# Patient Record
Sex: Male | Born: 1960 | Race: White | Hispanic: No | Marital: Single | Smoking: Never smoker
Health system: Southern US, Community
[De-identification: ages and names within clinical notes are randomized; demographics above are authoritative.]

## PROBLEM LIST (undated history)

## (undated) DIAGNOSIS — L570 Actinic keratosis: Secondary | ICD-10-CM

## (undated) DIAGNOSIS — I499 Cardiac arrhythmia, unspecified: Secondary | ICD-10-CM

## (undated) DIAGNOSIS — F439 Reaction to severe stress, unspecified: Secondary | ICD-10-CM

## (undated) DIAGNOSIS — K589 Irritable bowel syndrome without diarrhea: Secondary | ICD-10-CM

## (undated) DIAGNOSIS — K449 Diaphragmatic hernia without obstruction or gangrene: Secondary | ICD-10-CM

## (undated) DIAGNOSIS — F32A Depression, unspecified: Secondary | ICD-10-CM

## (undated) HISTORY — DX: Irritable bowel syndrome, unspecified: K58.9

## (undated) HISTORY — DX: Diaphragmatic hernia without obstruction or gangrene: K44.9

## (undated) HISTORY — DX: Depression, unspecified: F32.A

## (undated) HISTORY — DX: Cardiac arrhythmia, unspecified: I49.9

## (undated) HISTORY — PX: SHOULDER SURGERY: SHX246

## (undated) HISTORY — PX: OTHER SURGICAL HISTORY: SHX169

## (undated) HISTORY — DX: Actinic keratosis: L57.0

## (undated) HISTORY — DX: Reaction to severe stress, unspecified: F43.9

---
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
## 1991-07-04 HISTORY — PX: CARDIAC CATHETERIZATION: SHX172

## 2001-10-21 ENCOUNTER — Emergency Department (HOSPITAL_COMMUNITY): Admission: EM | Admit: 2001-10-21 | Discharge: 2001-10-21 | Payer: Self-pay | Admitting: Emergency Medicine

## 2001-10-21 ENCOUNTER — Encounter: Payer: Self-pay | Admitting: Emergency Medicine

## 2002-04-27 ENCOUNTER — Emergency Department (HOSPITAL_COMMUNITY): Admission: EM | Admit: 2002-04-27 | Discharge: 2002-04-27 | Payer: Self-pay | Admitting: Emergency Medicine

## 2002-05-23 ENCOUNTER — Encounter: Payer: Self-pay | Admitting: Gastroenterology

## 2002-05-23 ENCOUNTER — Encounter: Admission: RE | Admit: 2002-05-23 | Discharge: 2002-05-23 | Payer: Self-pay | Admitting: Gastroenterology

## 2002-06-10 ENCOUNTER — Encounter: Payer: Self-pay | Admitting: Gastroenterology

## 2002-06-10 ENCOUNTER — Ambulatory Visit (HOSPITAL_COMMUNITY): Admission: RE | Admit: 2002-06-10 | Discharge: 2002-06-10 | Payer: Self-pay | Admitting: Gastroenterology

## 2002-07-05 ENCOUNTER — Emergency Department (HOSPITAL_COMMUNITY): Admission: EM | Admit: 2002-07-05 | Discharge: 2002-07-05 | Payer: Self-pay

## 2002-07-08 ENCOUNTER — Ambulatory Visit (HOSPITAL_COMMUNITY): Admission: RE | Admit: 2002-07-08 | Discharge: 2002-07-08 | Payer: Self-pay | Admitting: Gastroenterology

## 2002-08-20 ENCOUNTER — Ambulatory Visit: Admission: RE | Admit: 2002-08-20 | Discharge: 2002-08-20 | Payer: Self-pay | Admitting: Pulmonary Disease

## 2002-09-15 ENCOUNTER — Ambulatory Visit (HOSPITAL_COMMUNITY): Admission: RE | Admit: 2002-09-15 | Discharge: 2002-09-15 | Payer: Self-pay | Admitting: Gastroenterology

## 2004-09-20 ENCOUNTER — Ambulatory Visit: Payer: Self-pay | Admitting: *Deleted

## 2004-10-05 ENCOUNTER — Ambulatory Visit: Payer: Self-pay

## 2006-01-01 ENCOUNTER — Emergency Department (HOSPITAL_COMMUNITY): Admission: EM | Admit: 2006-01-01 | Discharge: 2006-01-01 | Payer: Self-pay | Admitting: Emergency Medicine

## 2006-01-02 ENCOUNTER — Encounter: Admission: RE | Admit: 2006-01-02 | Discharge: 2006-01-02 | Payer: Self-pay | Admitting: Otolaryngology

## 2006-05-12 ENCOUNTER — Emergency Department (HOSPITAL_COMMUNITY): Admission: EM | Admit: 2006-05-12 | Discharge: 2006-05-12 | Payer: Self-pay | Admitting: Emergency Medicine

## 2006-05-21 ENCOUNTER — Encounter (INDEPENDENT_AMBULATORY_CARE_PROVIDER_SITE_OTHER): Payer: Self-pay | Admitting: Specialist

## 2006-05-21 ENCOUNTER — Ambulatory Visit (HOSPITAL_COMMUNITY): Admission: RE | Admit: 2006-05-21 | Discharge: 2006-05-21 | Payer: Self-pay | Admitting: Gastroenterology

## 2006-05-22 ENCOUNTER — Ambulatory Visit (HOSPITAL_COMMUNITY): Admission: RE | Admit: 2006-05-22 | Discharge: 2006-05-22 | Payer: Self-pay | Admitting: Gastroenterology

## 2006-07-19 ENCOUNTER — Ambulatory Visit (HOSPITAL_COMMUNITY): Admission: RE | Admit: 2006-07-19 | Discharge: 2006-07-19 | Payer: Self-pay | Admitting: Gastroenterology

## 2006-07-24 ENCOUNTER — Ambulatory Visit (HOSPITAL_COMMUNITY): Admission: RE | Admit: 2006-07-24 | Discharge: 2006-07-24 | Payer: Self-pay | Admitting: Gastroenterology

## 2007-08-23 ENCOUNTER — Encounter: Admission: RE | Admit: 2007-08-23 | Discharge: 2007-08-23 | Payer: Self-pay | Admitting: Gastroenterology

## 2009-06-19 IMAGING — CT CT PELVIS W/ CM
3 of 6 series · 13 of 46 positions shown, 19 images · IV contrast (READICAT/WATER & [ID] OMNI 300)
Comparison: CT of 07/24/06.

CLINICAL DATA: Followup hepatic cyst.  
ABDOMEN CT WITH CONTRAST:
TECHNIQUE: Multidetector CT imaging of the abdomen was performed following the standard protocol during bolus administration of intravenous contrast.
Contrast:   cc Omnipaque 300
TECHNIQUE: Multidetector CT imaging of the pelvis was performed following the standard protocol during bolus administration of intravenous contrast.

[Series 3: routine abdomen · axial · 0.70mm/px · z∈[-359,-64]mm · 9 of 75 slices shown, 15 images]
[im 8/75  soft-tissue]
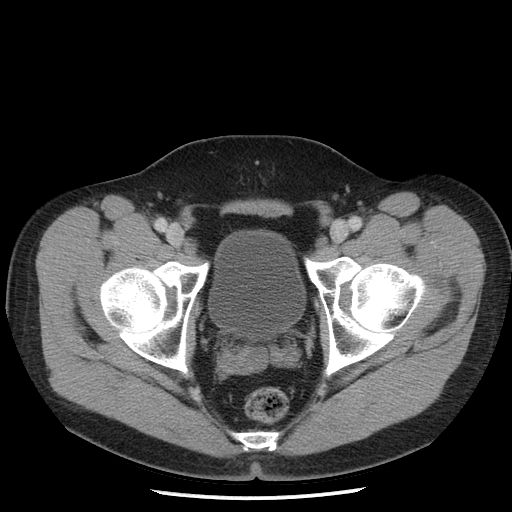
[im 8/75  bone]
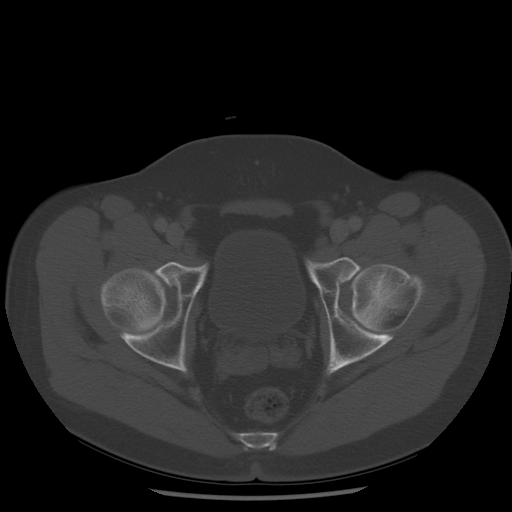
[im 15/75  soft-tissue]
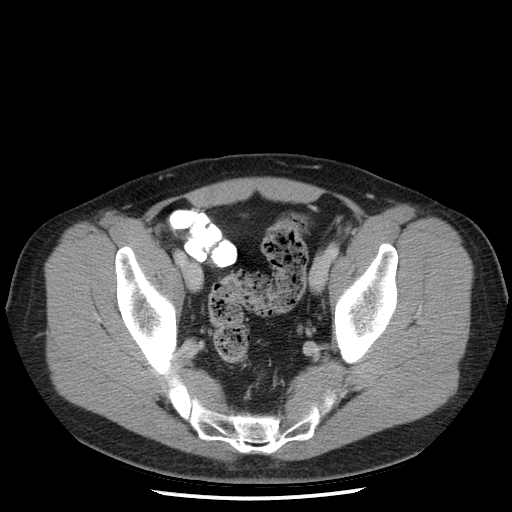
[im 23/75  soft-tissue]
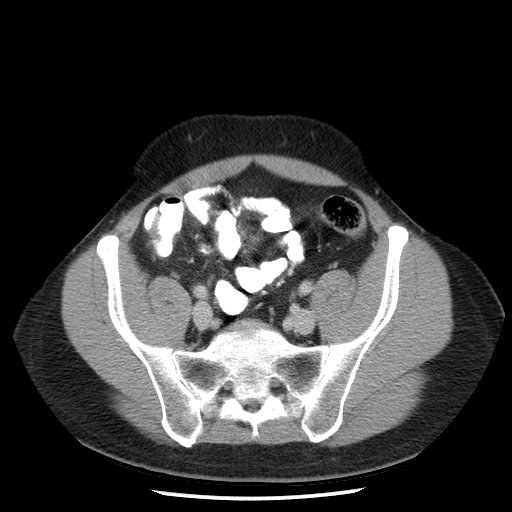
[im 30/75  soft-tissue]
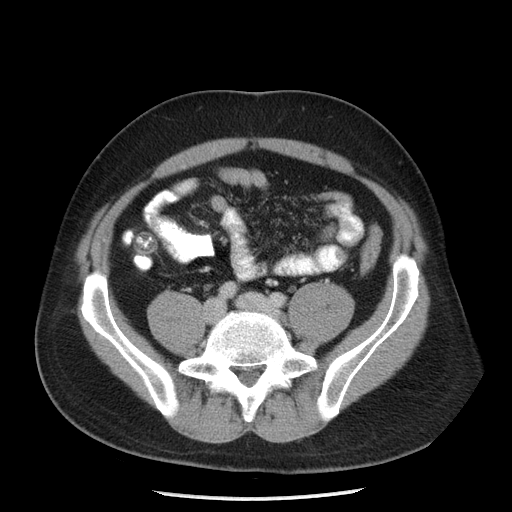
[im 38/75  soft-tissue]
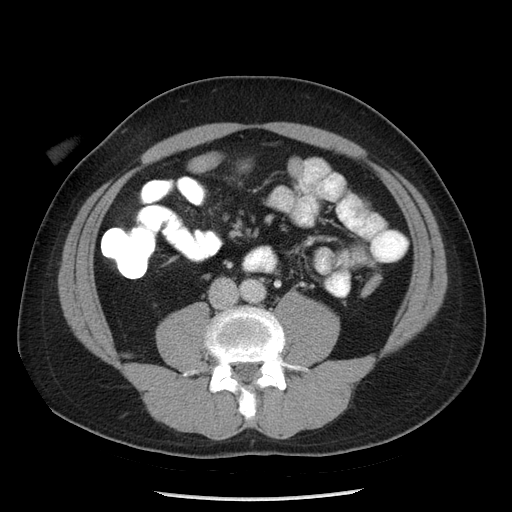
[im 45/75  soft-tissue]
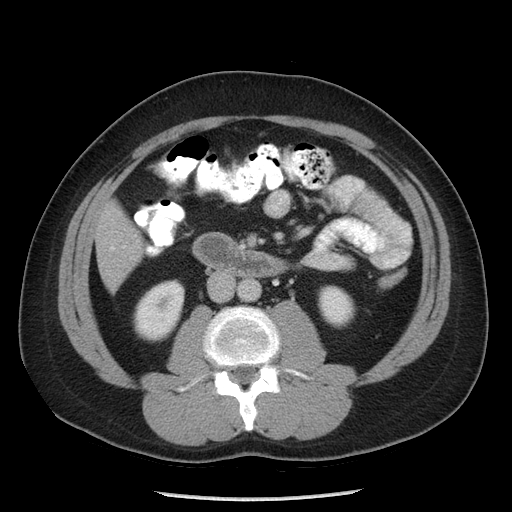
[im 45/75  lung]
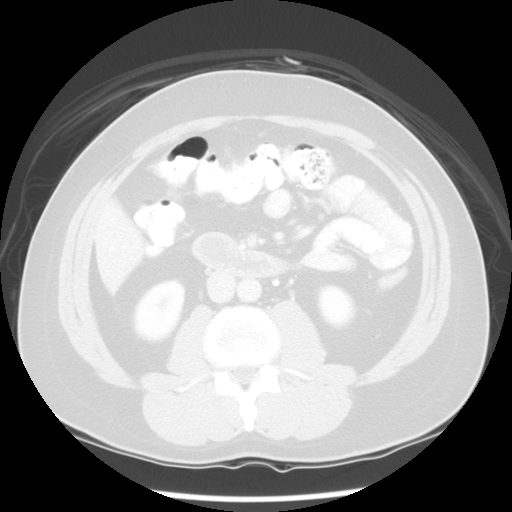
[im 52/75  soft-tissue]
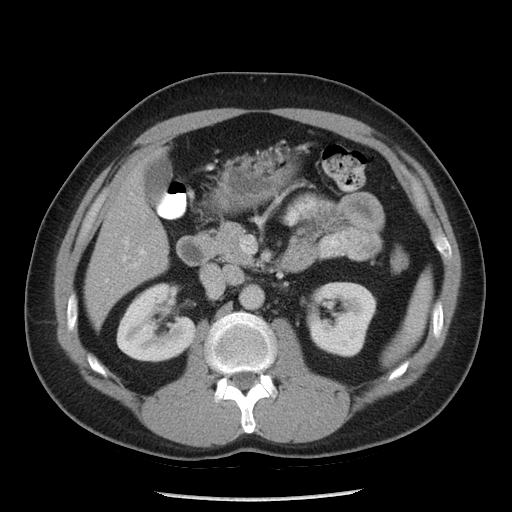
[im 52/75  lung]
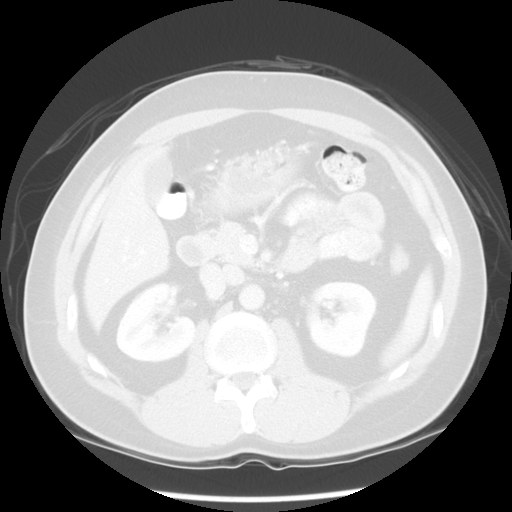
[im 60/75  soft-tissue]
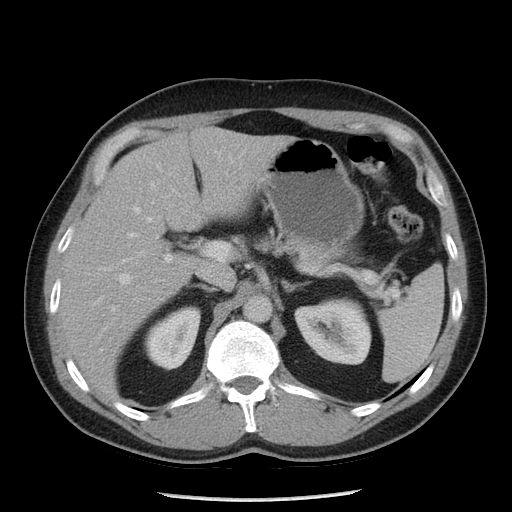
[im 60/75  lung]
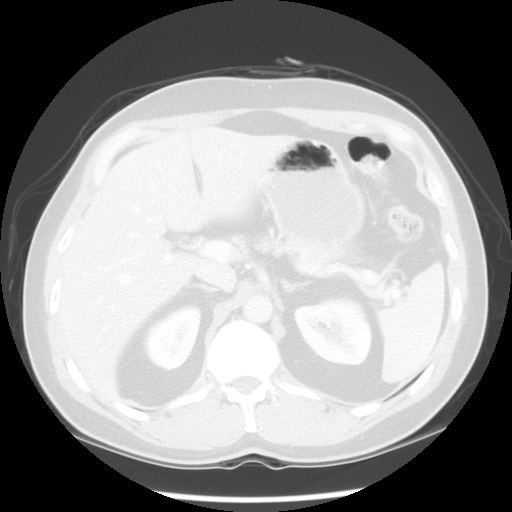
[im 67/75  soft-tissue]
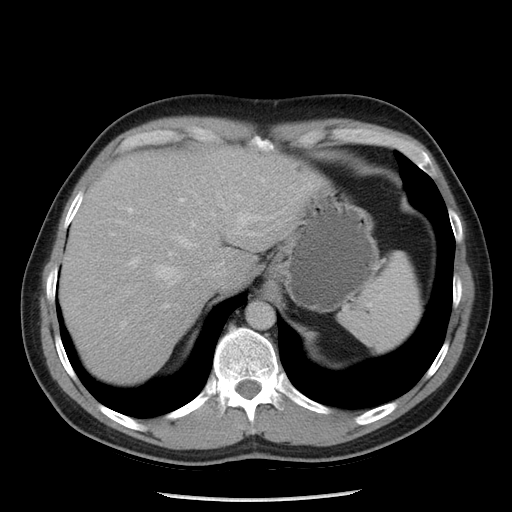
[im 67/75  lung]
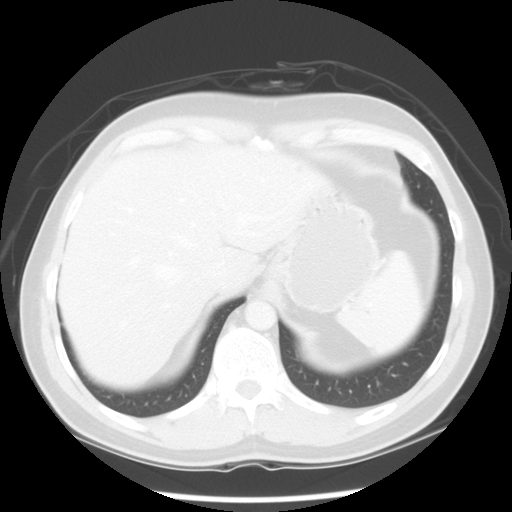
[im 67/75  bone]
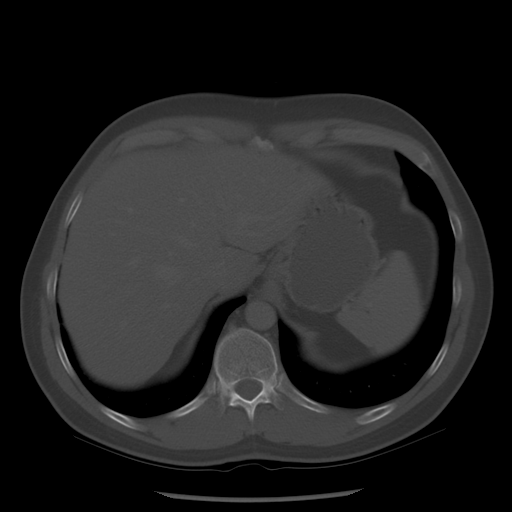

[Series 601: coronal body · coronal · 0.80mm/px · 1 of 119 slices shown]
[im 40/119  soft-tissue]
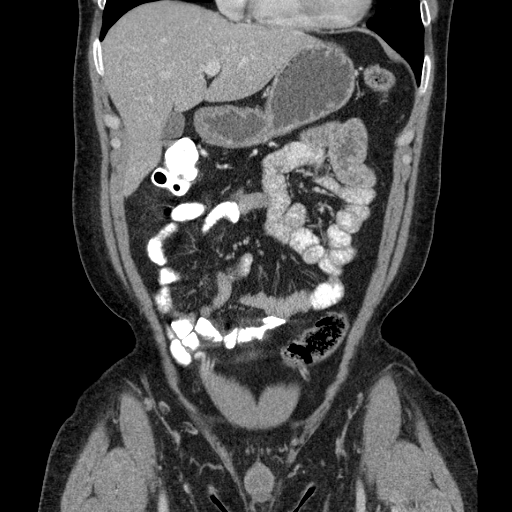

[Series 602: sagittal body · sagittal · 0.80mm/px · 3 of 145 slices shown]
[im 49/145  soft-tissue]
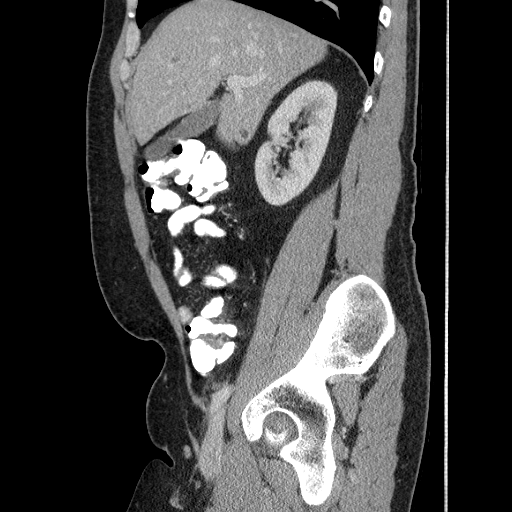
[im 65/145  soft-tissue]
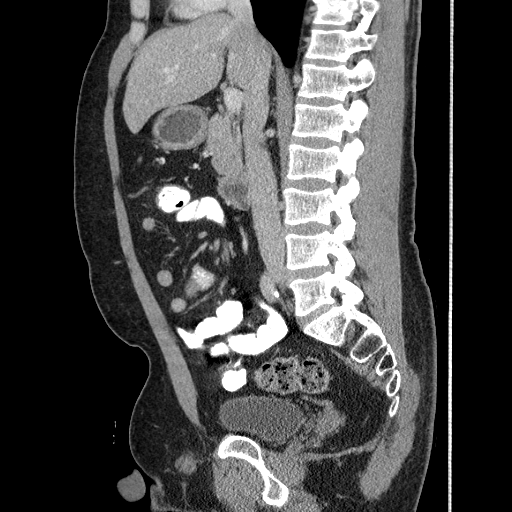
[im 81/145  soft-tissue]
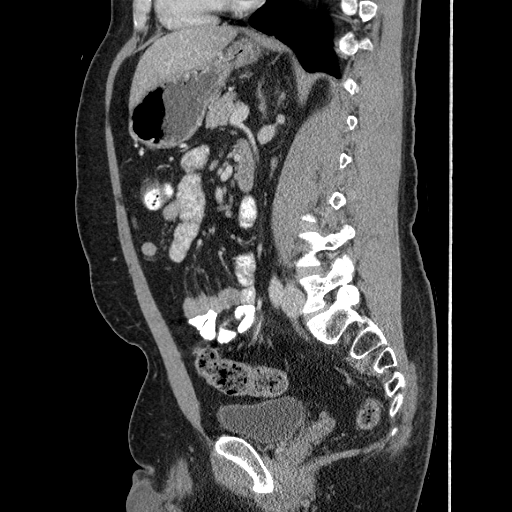

[13 of 46 positions shown; findings below may reference images not displayed]

FINDINGS: The lung bases are clear.  The small low attenuation structures primarily in the right lobe of liver appear completely stable.  On delayed images the majority of these small low attenuation structures persist and therefore these are most consistent with small hepatic cysts.  No new hepatic lesion is seen and no ducal dilatation is noted.  No calcified gallstones are noted.  The pancreas is normal in size and the pancreatic duct is not dilated.  The adrenal glands and spleen appear normal.  The kidneys enhance and on delayed images the pelvocaliceal systems appear normal.  The abdominal aorta is normal in caliber.
IMPRESSION: The previously noted small low attenuation hepatic lesions are stable in size and number and are most consistent with small cysts.  No new abnormality is seen. 
PELVIS CT WITH CONTRAST:
FINDINGS: The urinary bladder is unremarkable.   Terminal ileum appears normal.  No pelvic mass or fluid is seen.  The prostate is normal in size.  No bony abnormality is noted.
IMPRESSION: Negative CT of the pelvis.

## 2010-07-24 ENCOUNTER — Encounter: Payer: Self-pay | Admitting: Gastroenterology

## 2010-11-09 ENCOUNTER — Emergency Department (HOSPITAL_COMMUNITY)
Admission: EM | Admit: 2010-11-09 | Discharge: 2010-11-09 | Disposition: A | Discharge status: Home / Self Care | Payer: BC Managed Care – PPO | Attending: Emergency Medicine | Admitting: Emergency Medicine

## 2010-11-09 DIAGNOSIS — R350 Frequency of micturition: Secondary | ICD-10-CM | POA: Insufficient documentation

## 2010-11-09 DIAGNOSIS — R5383 Other fatigue: Secondary | ICD-10-CM | POA: Insufficient documentation

## 2010-11-09 DIAGNOSIS — R5381 Other malaise: Secondary | ICD-10-CM | POA: Insufficient documentation

## 2010-11-09 DIAGNOSIS — M549 Dorsalgia, unspecified: Secondary | ICD-10-CM | POA: Insufficient documentation

## 2010-11-09 DIAGNOSIS — G4700 Insomnia, unspecified: Secondary | ICD-10-CM | POA: Insufficient documentation

## 2010-11-09 LAB — DIFFERENTIAL
Eosinophils Absolute: 0.2 10*3/uL (ref 0.0–0.7)
Lymphs Abs: 2.4 10*3/uL (ref 0.7–4.0)
Monocytes Absolute: 0.9 10*3/uL (ref 0.1–1.0)

## 2010-11-09 LAB — URINALYSIS, ROUTINE W REFLEX MICROSCOPIC
Hgb urine dipstick: NEGATIVE
Ketones, ur: NEGATIVE mg/dL
Protein, ur: NEGATIVE mg/dL
Specific Gravity, Urine: 1.012 (ref 1.005–1.030)
Urobilinogen, UA: 0.2 mg/dL (ref 0.0–1.0)

## 2010-11-09 LAB — CBC
Platelets: 264 10*3/uL (ref 150–400)
RBC: 4.36 MIL/uL (ref 4.22–5.81)
RDW: 12.6 % (ref 11.5–15.5)

## 2010-11-09 LAB — BASIC METABOLIC PANEL
CO2: 27 mEq/L (ref 19–32)
Calcium: 9.3 mg/dL (ref 8.4–10.5)
Creatinine, Ser: 0.82 mg/dL (ref 0.4–1.5)
GFR calc Af Amer: >60 mL/min (ref >60)
GFR calc non Af Amer: >60 mL/min (ref >60)
Glucose, Bld: 99 mg/dL (ref 70–99)
Potassium: 4 mEq/L (ref 3.5–5.1)
Sodium: 136 mEq/L (ref 135–145)

## 2010-11-09 LAB — HIV ANTIBODY (ROUTINE TESTING W REFLEX): HIV: NONREACTIVE

## 2010-11-18 NOTE — Op Note (Signed)
   NAME:  Juan Garner, Juan Garner                         ACCOUNT NO.:  [id number]   MEDICAL RECORD NO.:  [id number]                   PATIENT TYPE:  AMB   LOCATION:  ENDO                                 FACILITY:  [facility]   PHYSICIAN:  Anselmo Rod, M.D.               DATE OF BIRTH:  Dec 06, 1960   DATE OF PROCEDURE:  07/08/2002  DATE OF DISCHARGE:                                 OPERATIVE REPORT   PROCEDURE:  Esophagogastroduodenoscopy endoscopy.   ENDOSCOPIST:  Anselmo Rod, M.D.   INSTRUMENT USED:  Olympus video panendoscope.   INDICATIONS FOR PROCEDURE:  The patient is a 50 year old white male with a  history of epigastric burning, retrosternal pain in spite of double dose  proton pump inhibitors used and H2 blockers at bedtime. Rule out ulcer  disease.   <PREPROCEDURE PREPARATION/>  Informed consent was procured from the patient. The patient was fasted for 8  hours prior to  the procedure.   <PREPROCEDURE PHYSICAL/>  The patient had stable vital signs. Neck was supple. Chest was clear to  auscultation, S1, S2. Abdomen soft with normal bowel sounds.   DESCRIPTION OF PROCEDURE:  The patient was placed in the left lateral  decubitus position and sedated with 30 mg of Demerol and 4 mg of Versed  intravenously. Once the patient was  adequately sedated and maintained on  low flow oxygen and continuous cardiac monitoring, the Olympus  video  panendoscope was advanced through the mouthpiece over the tongue into the  esophagus under direct visualization.   The entire esophagus appeared normal with no evidence of ring, stricture,  masses, esophagitis or Barrett's mucosa.  The scope was then advanced into  the stomach.  The entire gastric mucosa appears healthy. Retroflexed view  revealed no abnormalities. There is mild duodenitis in the duodenal bulb.  The small bowel ileocecal valve appeared normal up to 60 cm.   IMPRESSION:  Normal EGD except for very mild duodenitis.   RECOMMENDATIONS:  1. Continue proton pump inhibitors.  2.     Follow anti reflux measures.  3. Avoid nonsteroidals, including aspirin.  4. A 24-hour pH if the symptoms persist.                                               Anselmo Rod, M.D.    JNM/MEDQ  D:  07/09/2002  T:  07/09/2002  Job:  [id number]   cc:   Lenard Lance  [address] [id number]  [place]  [place] [address]  Fax: [phone]

## 2010-11-18 NOTE — Op Note (Signed)
NAMEROMAIN, ERION NO.:  [id number]   MEDICAL RECORD NO.:  [id number]          PATIENT TYPE:  OUT   LOCATION:  XRAY                         FACILITY:  [facility]   PHYSICIAN:  Anselmo Rod, M.D.  DATE OF BIRTH:  [birth year]   DATE OF PROCEDURE:  05/21/2006  DATE OF DISCHARGE:                                 OPERATIVE REPORT   PROCEDURE PERFORMED:  Esophagogastroduodenoscopy with esophageal and gastric  biopsies.   ENDOSCOPIST:  Anselmo Rod, M.D.   INSTRUMENT USED:  Olympus video panendoscope.   INDICATIONS FOR PROCEDURE:  50 year old white male with a history of  epigastric pain, chest pain, and reflux undergoing EGD to rule out peptic  ulcer disease, esophagitis, gastritis, etc.   PREPROCEDURE PREPARATION:  Informed consent was procured from the patient.  The patient was fasted for 8 hours prior to procedure.  The risks and  benefits of the procedure, including bleeding, perforation, etc., were  discussed with the patient in great detail.   PREPROCEDURE PHYSICAL:  VITAL SIGNS:  Stable.  NECK:  Supple.  CHEST:  Clear to auscultation.  S1 and S2 regular.  ABDOMEN:  Soft with normal bowel sounds.   DESCRIPTION OF PROCEDURE:  The patient was placed in the left lateral  decubitus position and sedated with 100 mcg of fentanyl and 10 mg of Versed  given intravenously in slow incremental doses.  Once the patient was  adequately sedated and maintained on low-flow oxygen and continuous cardiac  monitoring, the Olympus video panendoscope was advanced through the  mouthpiece, over the tongue, and into the esophagus under direct vision.  A  small patch of what appeared to be Barrett's-like mucosa was biopsied above  the Z-line.  The rest of the esophagus appeared normal.  There was no  evidence of stricture or esophagitis or masses.  There was diffuse gastritis  noted on advancing the scope into the stomach.  Biopsies were done to rule  out the presence of H  pylori by pathology.  The proximal small bowel  appeared normal.  There was no obstruction.  Retroflexion in the high cardia  revealed no abnormalities.  The patient tolerated the procedure well without  immediate complications.   IMPRESSION:  1. Question Barrett's mucosa.  Small patch biopsied above the Z-line.  2. Diffuse gastritis, biopsies done for Helicobacter pylori.  3. Normal proximal small bowel.  4. No ulcers, erosions, masses, or polyps identified.   RECOMMENDATIONS:  1. Await pathology results.  2. Avoid all nonsteroidals for now.  3. Continue Prevacid as before along with Zantac at bedtime.  4. Proceed with an abdominal ultrasound and HIDA scan in the morning as      planned.  5. Outpatient followup within the next 2 weeks for further      recommendations.      Anselmo Rod, M.D.  Electronically Signed     JNM/MEDQ  D:  05/22/2006  T:  05/22/2006  Job:  [id number]

## 2021-03-02 ENCOUNTER — Encounter: Payer: Self-pay | Admitting: *Deleted

## 2021-03-03 ENCOUNTER — Other Ambulatory Visit: Payer: Self-pay

## 2021-03-03 ENCOUNTER — Encounter: Payer: Self-pay | Admitting: Neurology

## 2021-03-03 ENCOUNTER — Ambulatory Visit: Payer: BC Managed Care – PPO | Admitting: Neurology

## 2021-03-03 VITALS — BP 113/67 | HR 87 | Ht 69.5 in | Wt 190.0 lb

## 2021-03-03 DIAGNOSIS — Z820 Family history of epilepsy and other diseases of the nervous system: Secondary | ICD-10-CM

## 2021-03-03 DIAGNOSIS — G478 Other sleep disorders: Secondary | ICD-10-CM

## 2021-03-03 DIAGNOSIS — G4719 Other hypersomnia: Secondary | ICD-10-CM | POA: Diagnosis not present

## 2021-03-03 DIAGNOSIS — E663 Overweight: Secondary | ICD-10-CM

## 2021-03-03 DIAGNOSIS — R002 Palpitations: Secondary | ICD-10-CM

## 2021-03-03 DIAGNOSIS — G479 Sleep disorder, unspecified: Secondary | ICD-10-CM

## 2021-03-03 DIAGNOSIS — R0683 Snoring: Secondary | ICD-10-CM | POA: Diagnosis not present

## 2021-03-03 NOTE — Progress Notes (Signed)
Subjective:    Patient ID: Juan Garner is a 60 y.o. male.  HPI   Huston Foley, MD, PhD [facility] [address] [address] [place], [place] [address]  Dear Dr. Leonor Liv,   I saw your patient, Juan Garner, upon your kind request in my sleep clinic today for initial consultation of his sleep disorder, in particular, nonrestorative sleep and sleep disruption.  The patient is unaccompanied today.  As you know, [name] is a 60 year old right-handed gentleman with an underlying medical history of irritable bowel syndrome, Hiatal hernia, depression, and overweight state, who reports a longstanding history of difficulty maintaining sleep.  He has snored off and on, currently lives alone.  He is not sure if he has breathing pauses while asleep, has not woken up with a sense of gasping for air but has had nighttime palpitations before.  He has tried melatonin at night which helped him sleep just a little bit longer at night.  He tries to keep a schedule.  Bedtime is generally around 10 PM and rise time around 6 AM.  He does not have a TV in his bedroom and does not have any pets in the house, no children.  He drinks caffeine in the form of green tea, generally 1 serving per day.  He is a non-smoker and does not drink alcohol.  He helps take care of his parents, they live close by, both are in their 51s, dad is on hemodialysis. I reviewed your office note from 12/20/2020.  His Epworth sleepiness score is 2 out of 24, fatigue severity score is 21 out of 63.  He has recently been started on imipramine.  He was on it in the past.  He was started on 20 mg at bedtime and is now taking 30 mg at bedtime.  He does recall dreaming but very briefly and does not recall his dreams well.  He denies any telltale symptoms of restless leg syndrome.  He does not have any night to night nocturia or recurrent morning headaches.  His father has sleep apnea but is not currently using a CPAP  machine.  Patient had a tonsillectomy at age 88.  His Past Medical History Is Significant For: Past Medical History:  Diagnosis Date   Arrhythmia    Depression    Hiatal hernia    Irritable bowel    Keratosis    Stress    His Past Surgical History Is Significant For:   His Family History Is Significant For: Family History  Problem Relation Age of Onset   Actinic keratosis Mother    Hypertension Father    Diabetes Father    Hearing loss Father    Depression Father    Vision loss Father     His Social History Is Significant For: Social History   Socioeconomic History   Marital status: Single    Spouse name: Not on file   Number of children: Not on file   Years of education: Not on file   Highest education level: Not on file  Occupational History   Not on file  Tobacco Use   Smoking status: Never   Smokeless tobacco: Never  Vaping Use   Vaping Use: Never used  Substance and Sexual Activity   Alcohol use: Not on file    Comment: never   Drug use: Never   Sexual activity: Not on file  Other Topics Concern   Not on file  Social History Narrative  PHD literature, 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  Caffeine green tea, dk chocolate.    Social Determinants of Health   Financial Resource Strain: Not on file  Food Insecurity: Not on file  Transportation Needs: Not on file  Physical Activity: Not on file  Stress: Not on file  Social Connections: Not on file    His Allergies Are:  No Known Allergies:   His Current Medications Are:  Outpatient Encounter Medications as of 03/03/2021  Medication Sig   Biotin w/ Vitamins C & E (HAIR/SKIN/NAILS PO) Take by mouth daily. For men.   imipramine (TOFRANIL) 10 MG tablet Take 30 mg by mouth at bedtime.   lansoprazole (PREVACID) 15 MG capsule Take by mouth daily as needed.   Multiple Vitamin (MULTIVITAMIN) capsule Take 1 capsule by mouth daily.   VITAMIN D, CHOLECALCIFEROL, PO Take by mouth daily. 1000units   [DISCONTINUED] b  complex vitamins capsule Take by mouth.   No facility-administered encounter medications on file as of 03/03/2021.  :   Review of Systems:  Out of a complete 14 point review of systems, all are reviewed and negative with the exception of these symptoms as listed below:   Review of Systems  Neurological:        Sleep disorder/disruptive sleep. ESS: 2, FSS 21. Always feeling tired, never refreshed.    Objective:  Neurological Exam  Physical Exam Physical Examination:   Vitals:   03/03/21 1502  BP: 113/67  Pulse: 87    General Examination: The patient is a very pleasant 60 y.o. male in no acute distress. He appears well-developed and well-nourished and well groomed.   HEENT: Normocephalic, atraumatic, pupils are equal, round and reactive to light, extraocular tracking is good without limitation to gaze excursion or nystagmus noted. Hearing is grossly intact. Face is symmetric with normal facial animation. Speech is clear with no dysarthria noted. There is no hypophonia. There is no lip, neck/head, jaw or voice tremor. Neck is supple with full range of passive and active motion. There are no carotid bruits on auscultation. Oropharynx exam reveals: mild mouth dryness, adequate dental hygiene and mild airway crowding, due to redundant soft palate, small airway entry, wider uvula.  Tonsils absent, Mallampati class II.  Neck circumference of 17 and three-quarter inches.  Tongue protrudes centrally and palate elevates symmetrically.  Chest: Clear to auscultation without wheezing, rhonchi or crackles noted.  Heart: S1+S2+0, regular and normal without murmurs, rubs or gallops noted.   Abdomen: Soft, non-tender and non-distended with normal bowel sounds appreciated on auscultation.  Extremities: There is no pitting edema in the distal lower extremities bilaterally.   Skin: Warm and dry without trophic changes noted.   Musculoskeletal: exam reveals no obvious joint deformities, tenderness or  joint swelling or erythema.   Neurologically:  Mental status: The patient is awake, alert and oriented in all 4 spheres. His immediate and remote memory, attention, language skills and fund of knowledge are appropriate. There is no evidence of aphasia, agnosia, apraxia or anomia. Speech is clear with normal prosody and enunciation. Thought process is linear. Mood is normal and affect is normal.  Cranial nerves II - XII are as described above under HEENT exam.  Motor exam: Normal bulk, strength and tone is noted. There is no tremor, Romberg is negative. Fine motor skills and coordination: grossly intact.  Cerebellar testing: No dysmetria or intention tremor. There is no truncal or gait ataxia.  Sensory exam: intact to light touch in the upper and lower extremities.  Gait, station and balance: He stands easily. No veering to one side is noted. No leaning to one side is noted. Posture is age-appropriate and stance is narrow based. Gait shows normal stride length and normal pace. No problems turning are noted. Tandem walk is unremarkable.                Assessment and Plan:     In summary, LOVELACE CERVENY is a very pleasant 60 y.o.-year old male with an underlying medical history of irritable bowel syndrome, Hiatal hernia, depression, and overweight state, whose history and physical exam are concerning for obstructive sleep apnea (OSA). I had a long chat with the patient about my findings and the diagnosis of OSA, its prognosis and treatment options. We talked about medical treatments, surgical interventions and non-pharmacological approaches. I explained in particular the risks and ramifications of untreated moderate to severe OSA, especially with respect to developing cardiovascular disease down the Road, including congestive heart failure, difficult to treat hypertension, cardiac arrhythmias, or stroke. Even type 2 diabetes has, in part, been linked to untreated OSA. Symptoms of untreated OSA include  daytime sleepiness, memory problems, mood irritability and mood disorder such as depression and anxiety, lack of energy, as well as recurrent headaches, especially morning headaches. We talked about trying to maintain a healthy lifestyle in general, as well as the importance of weight control. We also talked about the importance of good sleep hygiene. I recommended the following at this time: sleep study.  I explained the difference between a laboratory attended sleep study versus home sleep test.  I outlined possible treatment options for sleep apnea.  We talked about the use of CPAP therapy as well.  We will pick up our discussion after testing and plan to follow-up accordingly.  We will keep him posted as to his test results by phone or via [employer]. I answered all his questions today and he was in agreement. Thank you very much for allowing me to participate in the care of this nice patient. If I can be of any further assistance to you please do not hesitate to call me at [phone].  Sincerely,   Huston Foley, MD, PhD

## 2021-03-03 NOTE — Patient Instructions (Signed)

## 2021-04-21 ENCOUNTER — Other Ambulatory Visit: Payer: Self-pay

## 2021-04-21 ENCOUNTER — Ambulatory Visit (INDEPENDENT_AMBULATORY_CARE_PROVIDER_SITE_OTHER): Payer: BC Managed Care – PPO | Admitting: Neurology

## 2021-04-21 DIAGNOSIS — Z820 Family history of epilepsy and other diseases of the nervous system: Secondary | ICD-10-CM

## 2021-04-21 DIAGNOSIS — E663 Overweight: Secondary | ICD-10-CM

## 2021-04-21 DIAGNOSIS — R0683 Snoring: Secondary | ICD-10-CM | POA: Diagnosis not present

## 2021-04-21 DIAGNOSIS — G4719 Other hypersomnia: Secondary | ICD-10-CM

## 2021-04-21 DIAGNOSIS — G478 Other sleep disorders: Secondary | ICD-10-CM

## 2021-04-21 DIAGNOSIS — R002 Palpitations: Secondary | ICD-10-CM

## 2021-04-21 DIAGNOSIS — G479 Sleep disorder, unspecified: Secondary | ICD-10-CM

## 2021-04-21 DIAGNOSIS — G4720 Circadian rhythm sleep disorder, unspecified type: Secondary | ICD-10-CM

## 2021-04-29 NOTE — Procedures (Signed)
PATIENT'S NAME:  Juan Garner, Juan Garner DOB:      Aug 24, 1960      MR#:    [id number]     DATE OF RECORDING: 04/21/2021 REFERRING M.D.:  Juleen China, MD Study Performed:   Baseline Polysomnogram HISTORY: 60 year old man with a history of irritable bowel syndrome, hiatal hernia, depression, and overweight state, who reports a longstanding history of difficulty maintaining sleep, some snoring. The patient endorsed the Epworth Sleepiness Scale at 2 points. The patient's weight 190 pounds with a height of 70 (inches), resulting in a BMI of 27.5 kg/m2. The patient's neck circumference measured 17.8 inches.  CURRENT MEDICATIONS: Biotin, Tofranil, Prevacid, Multivitamins, Vitamin D   PROCEDURE:  This is a multichannel digital polysomnogram utilizing the Somnostar 11.2 system.  Electrodes and sensors were applied and monitored per AASM Specifications.   EEG, EOG, Chin and Limb EMG, were sampled at 200 Hz.  ECG, Snore and Nasal Pressure, Thermal Airflow, Respiratory Effort, CPAP Flow and Pressure, Oximetry was sampled at 50 Hz. Digital video and audio were recorded.      BASELINE STUDY  Lights Out was at 22:05 and Lights On at 05:03.  Total recording time (TRT) was 418.5 minutes, with a total sleep time (TST) of 330.5 minutes. The patient's sleep latency was 66 minutes, which is delayed. REM latency was 75.5 minutes, which is normal. The sleep efficiency was 79. %.     SLEEP ARCHITECTURE: WASO (Wake after sleep onset) was 27 minutes with mild sleep fragmentation noted.  There were 20 minutes in Stage N1, 212 minutes Stage N2, 5 minutes Stage N3 and 93.5 minutes in Stage REM.  The percentage of Stage N1 was 6.1%, Stage N2 was 64.1%, which in increased, Stage N3 was 1.5% and Stage R (REM sleep) was 28.3%, which is increased. The arousals were noted as: 30 were spontaneous, 0 were associated with PLMs, 1 were associated with respiratory events.  RESPIRATORY ANALYSIS:  There were a total of 5 respiratory events:  0  obstructive apneas, 0 central apneas and 3 mixed apneas with a total of 3 apneas and an apnea index (AI) of .5 /hour. There were 2 hypopneas with a hypopnea index of .4 /hour. The patient also had 0 respiratory event related arousals (RERAs).      The total APNEA/HYPOPNEA INDEX (AHI) was .9/hour and the total RESPIRATORY DISTURBANCE INDEX was  .9 /hour.  5 events occurred in REM sleep and 0 events in NREM. The REM AHI was  3.2 /hour, versus a non-REM AHI of 0. The patient spent 36 minutes of total sleep time in the supine position and 295 minutes in non-supine.. The supine AHI was 3.4 versus a non-supine AHI of 0.6.  OXYGEN SATURATION & C02:  The Wake baseline 02 saturation was 93%, with the lowest being 90%. Time spent below 89% saturation equaled 0 minutes.  PERIODIC LIMB MOVEMENTS: The patient had a total of 0 Periodic Limb Movements.  The Periodic Limb Movement (PLM) index was 0 and the PLM Arousal index was 0/hour. Audio and video analysis did not show any abnormal or unusual movements, behaviors, phonations or vocalizations. The patient took 1 bathroom break. Mild snoring was noted. The EKG was in keeping with normal sinus rhythm (NSR).  Post-study, the patient indicated that sleep was the same as usual.   IMPRESSION:  Primary Snoring Dysfunctions associated with sleep stages or arousal from sleep  RECOMMENDATIONS:  This study does not demonstrate any significant obstructive or central sleep disordered breathing with the exception of  snoring. This study does not support an intrinsic sleep disorder as a cause of the patient's symptoms. Other causes, including circadian rhythm disturbances, an underlying mood disorder, medication effect and/or an underlying medical problem cannot be ruled out. This study shows sleep fragmentation and abnormal sleep stage percentages; these are nonspecific findings and per se do not signify an intrinsic sleep disorder or a cause for the patient's sleep-related  symptoms. Causes include (but are not limited to) the first night effect of the sleep study, circadian rhythm disturbances, medication effect or an underlying mood disorder or medical problem.  The patient should be cautioned not to drive, work at heights, or operate dangerous or heavy equipment when tired or sleepy. Review and reiteration of good sleep hygiene measures should be pursued with any patient. The patient will be advised to follow up with the referring provider, who will be notified of the test results.  I certify that I have reviewed the entire raw data recording prior to the issuance of this report in accordance with the Standards of Accreditation of the American Academy of Sleep Medicine (AASM)   Huston Foley, MD, PhD Diplomat, American Board of Neurology and Sleep Medicine (Neurology and Sleep Medicine)

## 2021-05-02 ENCOUNTER — Encounter: Payer: Self-pay | Admitting: Neurology

## 2021-05-02 ENCOUNTER — Telehealth: Payer: Self-pay | Admitting: Neurology

## 2021-05-02 NOTE — Telephone Encounter (Signed)
Called pt back. He read the results on mychart. He wanted to know if there was any suggestion of a medication he can discuss with primary care. He has only tried melatonin and it doesn't help. He states every night almost without fail he wakes up around 2-3 AM. He never feels rested when he awakens. 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

## 2021-05-02 NOTE — Telephone Encounter (Signed)
He can certainly discuss several different prescription medications for insomnia with his primary care physician.  She is probably well aware of these medications.  There is nothing in particular I would recommend and most prescription sleeping pills are recommended for as needed and short-term use only.  I would recommend he make a follow-up appointment to discuss this further with his primary care physician.

## 2021-05-02 NOTE — Telephone Encounter (Signed)
Spoke with pt and discussed message below from Dr Frances Furbish. He verbalized understanding and appreciation. I advised sleep study results would be sent to Dr Leonor Liv.   Results routed to Dr Leonor Liv.

## 2021-05-02 NOTE — Telephone Encounter (Signed)
Pt returned phone call. Have some questions, would like a call back.

## 2021-05-02 NOTE — Telephone Encounter (Signed)
Called patient to discuss sleep study results. No answer at this time. LVM for the patient to call back.  Will send a mychart message as well. 

## 2021-05-02 NOTE — Telephone Encounter (Signed)
-----   Message from Huston Foley, MD sent at 04/29/2021  1:48 PM EDT ----- Patient referred by Dr. Leonor Liv, seen by me on 03/03/21, diagnostic PSG on 04/21/21.   Please call and notify the patient that the recent sleep study did not show any significant obstructive sleep apnea, except for snoring. It took him some time to fall asleep and overall he slept fairly, and oxygen saturation remained at or above 90%.  At this juncture, he can FU with his PCP.   Thanks,  Huston Foley, MD, PhD [facility])
